# Patient Record
Sex: Female | Born: 1995 | Race: Black or African American | Hispanic: No | Marital: Single | Smoking: Never smoker
Health system: Southern US, Community
[De-identification: ages and names within clinical notes are randomized; demographics above are authoritative.]

## PROBLEM LIST (undated history)

## (undated) DIAGNOSIS — E282 Polycystic ovarian syndrome: Secondary | ICD-10-CM

## (undated) DIAGNOSIS — M2550 Pain in unspecified joint: Secondary | ICD-10-CM

## (undated) DIAGNOSIS — I10 Essential (primary) hypertension: Secondary | ICD-10-CM

## (undated) DIAGNOSIS — R7303 Prediabetes: Secondary | ICD-10-CM

## (undated) DIAGNOSIS — M1990 Unspecified osteoarthritis, unspecified site: Secondary | ICD-10-CM

## (undated) DIAGNOSIS — K5900 Constipation, unspecified: Secondary | ICD-10-CM

## (undated) DIAGNOSIS — F419 Anxiety disorder, unspecified: Secondary | ICD-10-CM

## (undated) DIAGNOSIS — M549 Dorsalgia, unspecified: Secondary | ICD-10-CM

## (undated) DIAGNOSIS — E119 Type 2 diabetes mellitus without complications: Secondary | ICD-10-CM

## (undated) DIAGNOSIS — R002 Palpitations: Secondary | ICD-10-CM

## (undated) DIAGNOSIS — Z87448 Personal history of other diseases of urinary system: Secondary | ICD-10-CM

## (undated) DIAGNOSIS — F32A Depression, unspecified: Secondary | ICD-10-CM

## (undated) HISTORY — DX: Unspecified osteoarthritis, unspecified site: M19.90

## (undated) HISTORY — DX: Depression, unspecified: F32.A

## (undated) HISTORY — DX: Anxiety disorder, unspecified: F41.9

## (undated) HISTORY — DX: Prediabetes: R73.03

## (undated) HISTORY — DX: Palpitations: R00.2

## (undated) HISTORY — DX: Pain in unspecified joint: M25.50

## (undated) HISTORY — DX: Personal history of other diseases of urinary system: Z87.448

## (undated) HISTORY — DX: Dorsalgia, unspecified: M54.9

## (undated) HISTORY — DX: Constipation, unspecified: K59.00

---
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
## 2013-08-29 DIAGNOSIS — M25571 Pain in right ankle and joints of right foot: Secondary | ICD-10-CM | POA: Insufficient documentation

## 2013-08-29 DIAGNOSIS — E282 Polycystic ovarian syndrome: Secondary | ICD-10-CM | POA: Insufficient documentation

## 2013-08-29 DIAGNOSIS — I10 Essential (primary) hypertension: Secondary | ICD-10-CM | POA: Insufficient documentation

## 2014-02-06 DIAGNOSIS — N938 Other specified abnormal uterine and vaginal bleeding: Secondary | ICD-10-CM | POA: Insufficient documentation

## 2015-08-03 ENCOUNTER — Encounter (HOSPITAL_BASED_OUTPATIENT_CLINIC_OR_DEPARTMENT_OTHER): Payer: Self-pay | Admitting: *Deleted

## 2015-08-03 ENCOUNTER — Emergency Department (HOSPITAL_BASED_OUTPATIENT_CLINIC_OR_DEPARTMENT_OTHER)
Admission: EM | Admit: 2015-08-03 | Discharge: 2015-08-03 | Disposition: A | Discharge status: Home / Self Care | Payer: No Typology Code available for payment source | Attending: Emergency Medicine | Admitting: Emergency Medicine

## 2015-08-03 DIAGNOSIS — Z79899 Other long term (current) drug therapy: Secondary | ICD-10-CM | POA: Insufficient documentation

## 2015-08-03 DIAGNOSIS — M546 Pain in thoracic spine: Secondary | ICD-10-CM | POA: Diagnosis not present

## 2015-08-03 DIAGNOSIS — Z7984 Long term (current) use of oral hypoglycemic drugs: Secondary | ICD-10-CM | POA: Insufficient documentation

## 2015-08-03 DIAGNOSIS — Y939 Activity, unspecified: Secondary | ICD-10-CM | POA: Diagnosis not present

## 2015-08-03 DIAGNOSIS — Y999 Unspecified external cause status: Secondary | ICD-10-CM | POA: Diagnosis not present

## 2015-08-03 DIAGNOSIS — M7052 Other bursitis of knee, left knee: Secondary | ICD-10-CM | POA: Diagnosis not present

## 2015-08-03 DIAGNOSIS — Y929 Unspecified place or not applicable: Secondary | ICD-10-CM | POA: Diagnosis not present

## 2015-08-03 DIAGNOSIS — I10 Essential (primary) hypertension: Secondary | ICD-10-CM | POA: Diagnosis not present

## 2015-08-03 DIAGNOSIS — M719 Bursopathy, unspecified: Secondary | ICD-10-CM

## 2015-08-03 HISTORY — DX: Polycystic ovarian syndrome: E28.2

## 2015-08-03 HISTORY — DX: Essential (primary) hypertension: I10
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
## 2015-08-03 NOTE — Discharge Instructions (Signed)
Back Exercises °The following exercises strengthen the muscles that help to support the back. They also help to keep the lower back flexible. Doing these exercises can help to prevent back pain or lessen existing pain. °If you have back pain or discomfort, try doing these exercises 2-3 times each day or as told by your health care provider. When the pain goes away, do them once each day, but increase the number of times that you repeat the steps for each exercise (do more repetitions). If you do not have back pain or discomfort, do these exercises once each day or as told by your health care provider. °EXERCISES °Single Knee to Chest °Repeat these steps 3-5 times for each leg: °· Lie on your back on a firm bed or the floor with your legs extended. °· Bring one knee to your chest. Your other leg should stay extended and in contact with the floor. °· Hold your knee in place by grabbing your knee or thigh. °· Pull on your knee until you feel a gentle stretch in your lower back. °· Hold the stretch for 10-30 seconds. °· Slowly release and straighten your leg. °Pelvic Tilt °Repeat these steps 5-10 times: °· Lie on your back on a firm bed or the floor with your legs extended. °· Bend your knees so they are pointing toward the ceiling and your feet are flat on the floor. °· Tighten your lower abdominal muscles to press your lower back against the floor. This motion will tilt your pelvis so your tailbone points up toward the ceiling instead of pointing to your feet or the floor. °· With gentle tension and even breathing, hold this position for 5-10 seconds. °Cat-Cow °Repeat these steps until your lower back becomes more flexible: °· Get into a hands-and-knees position on a firm surface. Keep your hands under your shoulders, and keep your knees under your hips. You may place padding under your knees for comfort. °· Let your head hang down, and point your tailbone toward the floor so your lower back becomes rounded like the  back of a cat. °· Hold this position for 5 seconds. °· Slowly lift your head and point your tailbone up toward the ceiling so your back forms a sagging arch like the back of a cow. °· Hold this position for 5 seconds. °Press-Ups °Repeat these steps 5-10 times: °· Lie on your abdomen (face-down) on the floor. °· Place your palms near your head, about shoulder-width apart. °· While you keep your back as relaxed as possible and keep your hips on the floor, slowly straighten your arms to raise the top half of your body and lift your shoulders. Do not use your back muscles to raise your upper torso. You may adjust the placement of your hands to make yourself more comfortable. °· Hold this position for 5 seconds while you keep your back relaxed. °· Slowly return to lying flat on the floor. °Bridges °Repeat these steps 10 times: °1. Lie on your back on a firm surface. °2. Bend your knees so they are pointing toward the ceiling and your feet are flat on the floor. °3. Tighten your buttocks muscles and lift your buttocks off of the floor until your waist is at almost the same height as your knees. You should feel the muscles working in your buttocks and the back of your thighs. If you do not feel these muscles, slide your feet 1-2 inches farther away from your buttocks. °4. Hold this position for 3-5   seconds. °5. Slowly lower your hips to the starting position, and allow your buttocks muscles to relax completely. °If this exercise is too easy, try doing it with your arms crossed over your chest. °Abdominal Crunches °Repeat these steps 5-10 times: °1. Lie on your back on a firm bed or the floor with your legs extended. °2. Bend your knees so they are pointing toward the ceiling and your feet are flat on the floor. °3. Cross your arms over your chest. °4. Tip your chin slightly toward your chest without bending your neck. °5. Tighten your abdominal muscles and slowly raise your trunk (torso) high enough to lift your shoulder  blades a tiny bit off of the floor. Avoid raising your torso higher than that, because it can put too much stress on your low back and it does not help to strengthen your abdominal muscles. °6. Slowly return to your starting position. °Back Lifts °Repeat these steps 5-10 times: °1. Lie on your abdomen (face-down) with your arms at your sides, and rest your forehead on the floor. °2. Tighten the muscles in your legs and your buttocks. °3. Slowly lift your chest off of the floor while you keep your hips pressed to the floor. Keep the back of your head in line with the curve in your back. Your eyes should be looking at the floor. °4. Hold this position for 3-5 seconds. °5. Slowly return to your starting position. °SEEK MEDICAL CARE IF: °· Your back pain or discomfort gets much worse when you do an exercise. °· Your back pain or discomfort does not lessen within 2 hours after you exercise. °If you have any of these problems, stop doing these exercises right away. Do not do them again unless your health care provider says that you can. °SEEK IMMEDIATE MEDICAL CARE IF: °· You develop sudden, severe back pain. If this happens, stop doing the exercises right away. Do not do them again unless your health care provider says that you can. °  °This information is not intended to replace advice given to you by your health care provider. Make sure you discuss any questions you have with your health care provider. °  °Document Released: 05/08/2004 Document Revised: 12/20/2014 Document Reviewed: 05/25/2014 °Elsevier Interactive Patient Education ©2016 [employer] ° °Back Pain, Adult °Back pain is very common in adults. The cause of back pain is rarely dangerous and the pain often gets better over time. The cause of your back pain may not be known. Some common causes of back pain include: °· Strain of the muscles or ligaments supporting the spine. °· Wear and tear (degeneration) of the spinal disks. °· Arthritis. °· Direct injury  to the back. °For many people, back pain may return. Since back pain is rarely dangerous, most people can learn to manage this condition on their own. °HOME CARE INSTRUCTIONS °Watch your back pain for any changes. The following actions may help to lessen any discomfort you are feeling: °· Remain active. It is stressful on your back to sit or stand in one place for long periods of time. Do not sit, drive, or stand in one place for more than 30 minutes at a time. Take short walks on even surfaces as soon as you are able. Try to increase the length of time you walk each day. °· Exercise regularly as directed by your health care provider. Exercise helps your back heal faster. It also helps avoid future injury by keeping your muscles strong and flexible. °· Do not stay in   bed. Resting more than 1-2 days can delay your recovery. °· Pay attention to your body when you bend and lift. The most comfortable positions are those that put less stress on your recovering back. Always use proper lifting techniques, including: °¨ Bending your knees. °¨ Keeping the load close to your body. °¨ Avoiding twisting. °· Find a comfortable position to sleep. Use a firm mattress and lie on your side with your knees slightly bent. If you lie on your back, put a pillow under your knees. °· Avoid feeling anxious or stressed. Stress increases muscle tension and can worsen back pain. It is important to recognize when you are anxious or stressed and learn ways to manage it, such as with exercise. °· Take medicines only as directed by your health care provider. Over-the-counter medicines to reduce pain and inflammation are often the most helpful. Your health care provider may prescribe muscle relaxant drugs. These medicines help dull your pain so you can more quickly return to your normal activities and healthy exercise. °· Apply ice to the injured area: °¨ Put ice in a plastic bag. °¨ Place a towel between your skin and the bag. °¨ Leave the ice on  for 20 minutes, 2-3 times a day for the first 2-3 days. After that, ice and heat may be alternated to reduce pain and spasms. °· Maintain a healthy weight. Excess weight puts extra stress on your back and makes it difficult to maintain good posture. °SEEK MEDICAL CARE IF: °· You have pain that is not relieved with rest or medicine. °· You have increasing pain going down into the legs or buttocks. °· You have pain that does not improve in one week. °· You have night pain. °· You lose weight. °· You have a fever or chills. °SEEK IMMEDIATE MEDICAL CARE IF:  °· You develop new bowel or bladder control problems. °· You have unusual weakness or numbness in your arms or legs. °· You develop nausea or vomiting. °· You develop abdominal pain. °· You feel faint. °  °This information is not intended to replace advice given to you by your health care provider. Make sure you discuss any questions you have with your health care provider. °  °Document Released: 03/31/2005 Document Revised: 04/21/2014 Document Reviewed: 08/02/2013 °Elsevier Interactive Patient Education ©2016 [employer] ° °

## 2015-08-03 NOTE — ED Notes (Signed)
Pt made aware to return if symptoms worsen or if any life threatening symptoms occur.   

## 2018-07-31 ENCOUNTER — Other Ambulatory Visit: Payer: Self-pay

## 2018-07-31 ENCOUNTER — Emergency Department (HOSPITAL_BASED_OUTPATIENT_CLINIC_OR_DEPARTMENT_OTHER): Payer: Self-pay

## 2018-07-31 ENCOUNTER — Emergency Department (HOSPITAL_BASED_OUTPATIENT_CLINIC_OR_DEPARTMENT_OTHER)
Admission: EM | Admit: 2018-07-31 | Discharge: 2018-08-01 | Disposition: A | Discharge status: Home / Self Care | Payer: Self-pay | Attending: Emergency Medicine | Admitting: Emergency Medicine

## 2018-07-31 ENCOUNTER — Encounter (HOSPITAL_BASED_OUTPATIENT_CLINIC_OR_DEPARTMENT_OTHER): Payer: Self-pay

## 2018-07-31 DIAGNOSIS — Z79899 Other long term (current) drug therapy: Secondary | ICD-10-CM | POA: Insufficient documentation

## 2018-07-31 DIAGNOSIS — R0789 Other chest pain: Secondary | ICD-10-CM | POA: Insufficient documentation

## 2018-07-31 DIAGNOSIS — Y999 Unspecified external cause status: Secondary | ICD-10-CM | POA: Insufficient documentation

## 2018-07-31 DIAGNOSIS — Z7984 Long term (current) use of oral hypoglycemic drugs: Secondary | ICD-10-CM | POA: Insufficient documentation

## 2018-07-31 DIAGNOSIS — E119 Type 2 diabetes mellitus without complications: Secondary | ICD-10-CM | POA: Insufficient documentation

## 2018-07-31 DIAGNOSIS — Y92410 Unspecified street and highway as the place of occurrence of the external cause: Secondary | ICD-10-CM | POA: Insufficient documentation

## 2018-07-31 DIAGNOSIS — Y9389 Activity, other specified: Secondary | ICD-10-CM | POA: Insufficient documentation

## 2018-07-31 DIAGNOSIS — S60812A Abrasion of left wrist, initial encounter: Secondary | ICD-10-CM | POA: Insufficient documentation

## 2018-07-31 DIAGNOSIS — I10 Essential (primary) hypertension: Secondary | ICD-10-CM | POA: Insufficient documentation

## 2018-07-31 DIAGNOSIS — M25532 Pain in left wrist: Secondary | ICD-10-CM

## 2018-07-31 DIAGNOSIS — T148XXA Other injury of unspecified body region, initial encounter: Secondary | ICD-10-CM

## 2018-07-31 HISTORY — DX: Type 2 diabetes mellitus without complications: E11.9

## 2018-07-31 MED ORDER — ACETAMINOPHEN 500 MG PO TABS
1000.0000 mg | ORAL_TABLET | Freq: Once | ORAL | Status: AC
  Administered 2018-07-31: 1000 mg via ORAL
  Filled 2018-07-31: qty 2
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
## 2018-07-31 NOTE — ED Notes (Signed)
ED Provider at bedside. 

## 2018-07-31 NOTE — ED Notes (Signed)
PMS intact before and after. Pt tolerated well. All questions answered. 
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
## 2018-08-01 MED ORDER — METHOCARBAMOL 500 MG PO TABS: 500.0000 mg | ORAL_TABLET | Freq: Two times a day (BID) | ORAL | 0 refills | Status: DC
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
## 2018-08-02 ENCOUNTER — Other Ambulatory Visit: Payer: Self-pay

## 2018-08-02 ENCOUNTER — Emergency Department (HOSPITAL_COMMUNITY)
Admission: EM | Admit: 2018-08-02 | Discharge: 2018-08-02 | Disposition: A | Discharge status: Home / Self Care | Payer: Self-pay | Attending: Emergency Medicine | Admitting: Emergency Medicine

## 2018-08-02 ENCOUNTER — Encounter (HOSPITAL_COMMUNITY): Payer: Self-pay

## 2018-08-02 DIAGNOSIS — I10 Essential (primary) hypertension: Secondary | ICD-10-CM | POA: Insufficient documentation

## 2018-08-02 DIAGNOSIS — E119 Type 2 diabetes mellitus without complications: Secondary | ICD-10-CM | POA: Insufficient documentation

## 2018-08-02 DIAGNOSIS — Z79899 Other long term (current) drug therapy: Secondary | ICD-10-CM | POA: Insufficient documentation

## 2018-08-02 DIAGNOSIS — Z7984 Long term (current) use of oral hypoglycemic drugs: Secondary | ICD-10-CM | POA: Insufficient documentation

## 2018-08-02 DIAGNOSIS — J029 Acute pharyngitis, unspecified: Secondary | ICD-10-CM | POA: Insufficient documentation

## 2018-08-02 MED ORDER — FAMOTIDINE 20 MG PO TABS
20.0000 mg | ORAL_TABLET | Freq: Once | ORAL | Status: AC
  Administered 2018-08-02: 22:00:00 20 mg via ORAL
  Filled 2018-08-02: qty 1

## 2018-08-02 NOTE — ED Notes (Signed)
Pt tolerating fluids with no difficulty  

## 2018-08-02 NOTE — ED Provider Notes (Signed)
[facility]-EMERGENCY DEPT Provider Note   CSN: [id number] Arrival date & time: 08/02/18  2102    History   Chief Complaint Chief Complaint  Patient presents with  . Sore Throat    "tingling"    HPI Megan Malone is a 23 y.o. female with history of diabetes, hypertension presented emergency department today with chief complaint of throat pain.  Onset is acute starting 40 minutes prior to arrival.  Patient states she ate homemade rice krispy treats that had laced with marijuana and immediately after eating her throat started to feel numb. The numbness is located in posterior throat. She admits to feeling anxious. She did not take anything for pain prior to arrival.  She denies any shortness of breath, difficulty swallowing, chest pain, palpitations, abdominal pain, vomiting. History provided by pt.    Past Medical History:  Diagnosis Date  . Diabetes mellitus without complication (HCC)   . Hypertension   . Polycystic ovarian syndrome     There are no active problems to display for this patient.   History reviewed. No pertinent surgical history.   OB History   No obstetric history on file.      Home Medications    Prior to Admission medications   Medication Sig Start Date End Date Taking? Authorizing Provider  metFORMIN (GLUCOPHAGE) 500 MG tablet Take by mouth 2 (two) times daily with a meal.    [provider]  methocarbamol (ROBAXIN) 500 MG tablet Take 1 tablet (500 mg total) by mouth 2 (two) times daily. 08/01/18   Maxwell Caul, PA-C  triamterene-hydrochlorothiazide (DYAZIDE) 37.5-25 MG capsule Take 1 capsule by mouth daily.    [provider]    Family History No family history on file.  Social History Social History   Tobacco Use  . Smoking status: Never Smoker  . Smokeless tobacco: Never Used  Substance Use Topics  . Alcohol use: No  . Drug use: Not Currently     Allergies   Patient has no known allergies.   Review of Systems Review of Systems  Constitutional: Negative for chills and fever.  HENT: Positive for sore throat. Negative for congestion, ear discharge, ear pain, sinus pressure and sinus pain.   Eyes: Negative for pain, redness and visual disturbance.  Respiratory: Negative for cough and shortness of breath.   Cardiovascular: Negative for chest pain.  Gastrointestinal: Negative for abdominal pain, constipation, diarrhea, nausea and vomiting.  Genitourinary: Negative for dysuria and hematuria.  Musculoskeletal: Negative for back pain and neck pain.  Skin: Negative for wound.  Neurological: Negative for weakness, numbness and headaches.     Physical Exam Updated Vital Signs BP 111/66 (BP Location: Left Arm)   Pulse (!) 125   Temp 98.4 F (36.9 C) (Oral)   Ht 5\' 10"  (1.778 m)   Wt (!) 158.8 kg   LMP 07/20/2018   SpO2 100%   BMI 50.22 kg/m   Physical Exam Vitals signs and nursing note reviewed.  Constitutional:      Appearance: She is well-developed. She is not toxic-appearing.     Comments: Airway is intact. Pt is in no acute distress.  HENT:     Head: Normocephalic and atraumatic.     Nose: Nose normal.     Mouth/Throat:     Mouth: Mucous membranes are moist.     Pharynx: Oropharynx is clear.     Comments: No erythema to oropharynx, no edema, no exudate, no tonsillar swelling, voice normal, neck supple without  lymphadenopathy  Eyes:     General: No scleral icterus.       Right eye: No discharge.        Left eye: No discharge.     Extraocular Movements: Extraocular movements intact.     Conjunctiva/sclera: Conjunctivae normal.     Pupils: Pupils are equal, round, and reactive to light.  Neck:     Musculoskeletal: Normal range of motion. No neck rigidity or muscular tenderness.  Cardiovascular:     Rate and Rhythm: Regular rhythm. Tachycardia present.     Pulses: Normal pulses.     Heart sounds: Normal heart sounds.  Pulmonary:     Effort: Pulmonary effort  is normal.     Breath sounds: Normal breath sounds.  Abdominal:     General: There is no distension.  Musculoskeletal: Normal range of motion.  Lymphadenopathy:     Cervical: No cervical adenopathy.  Skin:    General: Skin is warm and dry.  Neurological:     Mental Status: She is oriented to person, place, and time.     Comments: Fluent speech, no facial droop.  Psychiatric:        Mood and Affect: Mood is anxious.        Behavior: Behavior normal.      ED Treatments / Results  Labs (all labs ordered are listed, but only abnormal results are displayed) Labs Reviewed - No data to display  EKG None  Radiology None  Procedures Procedures (including critical care time)  Medications Ordered in ED Medications  famotidine (PEPCID) tablet 20 mg (20 mg Oral Given 08/02/18 2208)     Initial Impression / Assessment and Plan / ED Course  I have reviewed the triage vital signs and the nursing notes.  Pertinent labs & imaging results that were available during my care of the patient were reviewed by me and considered in my medical decision making (see chart for details).  Pt is anxious, otherwise well appearing. She admits to eating homemade rice krispy treats laced with marijuana just prior to arrival. Pt's airway is intact, with SpO2 100% on room air. Will give PO pepcid and PO fluids and reassess.  Pt's tachycardia improved at discharge. Looking through EMR she has elevated heart rate at multiple visits. Encourage pt to continue PO fluids. She feels better after drinking ice water.   Patient is hemodynamically stable, in NAD, and able to ambulate in the ED. Evaluation does not show pathology that would require ongoing emergent intervention or inpatient treatment. I explained the diagnosis to the patient. Patient is comfortable with above plan and is stable for discharge at this time. All questions were answered prior to disposition. Strict return precautions for returning to the  ED were discussed. Encouraged follow up with PCP.  This note was prepared with assistance of Conservation officer, historic buildingsDragon voice recognition software. Occasional wrong-word or sound-a-like substitutions may have occurred due to the inherent limitations of voice recognition software.  Final Clinical Impressions(s) / ED Diagnoses   Final diagnoses:  Sore throat    ED Discharge Orders    None       Kathyrn Lasslbrizze, Kaitlyn E, PA-C 08/02/18 2249    Lorre NickAllen, Anthony, MD 08/04/18 [id number]

## 2018-08-02 NOTE — ED Triage Notes (Signed)
Pt stating she has felt tingling and tightness in her throat that started about 30 minutes ago. Pt stating she tried edibles for the first time before this started.

## 2018-08-02 NOTE — Discharge Instructions (Addendum)
You have been seen today for sore throat. Please read and follow all provided instructions. Return to the emergency room for worsening condition or new concerning symptoms.    1. Medications:  Continue usual home medications. Take medications as prescribed. Please review all of the medicines and only take them if you do not have an allergy to them.  2. Treatment: rest, drink plenty of fluids 3. Follow Up: Please follow up with your primary doctor in 2-5 days for discussion of your diagnoses and further evaluation after today's visit; Call today to arrange your follow up.    It is also a possibility that you have an allergic reaction to any of the medicines that you have been prescribed - Everybody reacts differently to medications and while MOST people have no trouble with most medicines, you may have a reaction such as nausea, vomiting, rash, swelling, shortness of breath. If this is the case, please stop taking the medicine immediately and contact your physician.  ?

## 2019-02-02 DIAGNOSIS — F329 Major depressive disorder, single episode, unspecified: Secondary | ICD-10-CM | POA: Insufficient documentation

## 2019-12-27 ENCOUNTER — Other Ambulatory Visit: Payer: Self-pay

## 2019-12-27 ENCOUNTER — Other Ambulatory Visit: Payer: Self-pay | Admitting: Critical Care Medicine

## 2019-12-27 DIAGNOSIS — Z20822 Contact with and (suspected) exposure to covid-19: Secondary | ICD-10-CM

## 2019-12-28 ENCOUNTER — Other Ambulatory Visit: Payer: Self-pay

## 2019-12-29 LAB — NOVEL CORONAVIRUS, NAA: SARS-CoV-2, NAA: NOT DETECTED

## 2019-12-29 LAB — SARS-COV-2, NAA 2 DAY TAT
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
## 2020-08-13 DIAGNOSIS — F411 Generalized anxiety disorder: Secondary | ICD-10-CM | POA: Insufficient documentation

## 2020-10-26 ENCOUNTER — Other Ambulatory Visit: Payer: Self-pay

## 2022-04-15 LAB — HM PAP SMEAR

## 2023-04-24 DIAGNOSIS — D219 Benign neoplasm of connective and other soft tissue, unspecified: Secondary | ICD-10-CM | POA: Insufficient documentation

## 2023-08-24 ENCOUNTER — Ambulatory Visit (INDEPENDENT_AMBULATORY_CARE_PROVIDER_SITE_OTHER): Payer: Self-pay | Admitting: Family Medicine

## 2023-08-24 ENCOUNTER — Encounter (INDEPENDENT_AMBULATORY_CARE_PROVIDER_SITE_OTHER): Payer: Self-pay

## 2023-08-24 DIAGNOSIS — E282 Polycystic ovarian syndrome: Secondary | ICD-10-CM | POA: Diagnosis not present

## 2023-08-24 DIAGNOSIS — Z0000 Encounter for general adult medical examination without abnormal findings: Secondary | ICD-10-CM

## 2023-08-24 DIAGNOSIS — F419 Anxiety disorder, unspecified: Secondary | ICD-10-CM | POA: Insufficient documentation

## 2023-08-24 DIAGNOSIS — F32A Depression, unspecified: Secondary | ICD-10-CM | POA: Insufficient documentation

## 2023-08-24 DIAGNOSIS — E782 Mixed hyperlipidemia: Secondary | ICD-10-CM | POA: Diagnosis not present

## 2023-08-24 DIAGNOSIS — L219 Seborrheic dermatitis, unspecified: Secondary | ICD-10-CM

## 2023-08-24 MED ORDER — ZEPBOUND 2.5 MG/0.5ML ~~LOC~~ SOAJ: 2.5000 mg | SUBCUTANEOUS | 0 refills | Status: DC

## 2023-08-24 NOTE — Assessment & Plan Note (Signed)
 Seborrheic dermatitis managed with ketoconazole shampoo. - Continue ketoconazole shampoo as needed.

## 2023-08-24 NOTE — Assessment & Plan Note (Signed)
 PCOS diagnosed in 2015. Previous metformin treatment ineffective. Managed with lifestyle modifications. - Continue lifestyle modifications including low-carb diet and exercise.

## 2023-08-24 NOTE — Patient Instructions (Signed)

## 2023-08-24 NOTE — Assessment & Plan Note (Signed)
 Obesity with current weight of 354 lbs. Previous weight loss with keto diet and intermittent fasting. Discussed challenges with weight management and potential benefits of GLP-1 receptor agonists. Zepbound preferred for weight loss and protective benefits for kidneys and heart. - Order Zepbound (GLP-1 receptor agonist) to assist with weight loss, pending insurance approval. - Refer to [facility] for comprehensive weight management. - Discuss potential self-pay option for Zepbound if insurance does not cover. - Encourage continuation of low-carb diet and exercise with a trainer.

## 2023-08-24 NOTE — Progress Notes (Signed)
 New Patient Office Visit  Subjective    Patient ID: Megan Malone, female    DOB: [birth year]  Age: 28 y.o. MRN: [id number]  CC:  Chief Complaint  Patient presents with   Establish Care    HPI Megan Malone presents to establish care. Living with dad and brother. 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    Discussed the use of AI scribe software for clinical note transcription with the patient, who gave verbal consent to proceed.  History of Present Illness Megan Malone is a 28 year old female with prediabetes, hypertension, and PCOS who presents for a new primary care provider and weight management consultation.  She is seeking a new primary care provider as her previous provider was out of network. She has a history of prediabetes, hypertension, and polycystic ovary syndrome (PCOS). She was diagnosed with prediabetes in 2015 and believes her last A1c was at the cutoff for diabetes.  She has been on Qsymia for weight management for a year, but ran out of the medication two weeks ago and has noticed a difference in her appetite and weight since stopping it. She was previously referred to a bariatric weight management program, but her insurance did not cover the specialist, and she was unable to afford [facility], which was recommended as an alternative.  She has a history of anxiety and depression, for which she takes Wellbutrin 300 mg once daily, Prozac 40 mg once daily, and hydroxyzine as needed. She also takes Zyrtec for allergies, ketoconazole shampoo for seborrheic dermatitis, magnesium, meloxicam, and hydrochlorothiazide 25 mg as needed for swelling. Hydrochlorothiazide helps with water retention, particularly when she is on her period.   Her family history includes a mother who had asthma, high blood pressure, arthritis, depression, and died early at 72 due to a mass in the abdominal region leading to bleeding. Her father has diabetes, high blood pressure, kidney disease, and varicose veins.  She has a brother with high blood pressure and another with an intellectual disability. Her father is currently discussing dialysis due to a GFR of 14, prompting her to check her own GFR.  She reports occasional alcohol use, no drug use, and is not sexually active. She does not use tobacco and has no medication allergies. Her periods are regular. She has a history of high cholesterol, which was noted to be a little high last year.  She has attempted weight loss through keto and intermittent fasting, losing 100 pounds initially, but regained weight after her mother's death. She is currently at 354 pounds, with a goal weight of 225 pounds. She experiences joint pain in her knees and hips, affecting her work and ability to squat. She has a trainer she meets with once a week but struggles with consistency in exercise.        Outpatient Encounter Medications as of 08/24/2023  Medication Sig   buPROPion (WELLBUTRIN XL) 300 MG 24 hr tablet Take 300 mg by mouth daily.   cetirizine (ZYRTEC) 10 MG tablet Take 10 mg by mouth.   FLUoxetine (PROZAC) 40 MG capsule Take 40 mg by mouth daily.   hydrochlorothiazide (HYDRODIURIL) 25 MG tablet Take 25 mg by mouth daily as needed (edema).   hydrOXYzine (ATARAX) 10 MG tablet Take 10 mg by mouth every 8 (eight) hours as needed.   ketoconazole (NIZORAL) 2 % shampoo APPLY TOPICALLY 2 TIMES A WEEK AS NEEDED   MAGNESIUM PO Take by mouth.   meloxicam (MOBIC) 7.5 MG tablet Take 7.5 mg by mouth daily.  QSYMIA 7.5-46 MG CP24 Take 1 capsule by mouth daily.   tirzepatide (ZEPBOUND) 2.5 MG/0.5ML Pen Inject 2.5 mg into the skin once a week.   [DISCONTINUED] albuterol (VENTOLIN HFA) 108 (90 Base) MCG/ACT inhaler Inhale 2 puffs into the lungs.   [DISCONTINUED] tirzepatide (MOUNJARO) 2.5 MG/0.5ML Pen Inject 2.5 mg into the skin once a week.   [DISCONTINUED] metFORMIN (GLUCOPHAGE) 500 MG tablet Take by mouth 2 (two) times daily with a meal.   [DISCONTINUED] methocarbamol   (ROBAXIN ) 500 MG tablet Take 1 tablet (500 mg total) by mouth 2 (two) times daily.   [DISCONTINUED] triamterene-hydrochlorothiazide (DYAZIDE) 37.5-25 MG capsule Take 1 capsule by mouth daily.   No facility-administered encounter medications on file as of 08/24/2023.    Past Medical History:  Diagnosis Date   Anxiety 2022   Arthritis 2022   Depression 2022   Diabetes mellitus without complication (HCC)    Hypertension    Polycystic ovarian syndrome     History reviewed. No pertinent surgical history.  Family History  Problem Relation Age of Onset   Arthritis Mother    Asthma Mother    Depression Mother    Early death Mother 67   Hypertension Mother    Diabetes Father    Hypertension Father    Kidney disease Father    Varicose Veins Father    Hypertension Brother    Intellectual disability Brother    Varicose Veins Paternal Grandmother    Kidney disease Maternal Aunt    Kidney disease Paternal Aunt    Kidney disease Paternal Uncle     Social History   Socioeconomic History   Marital status: Single    Spouse name: Not on file   Number of children: Not on file   Years of education: Not on file   Highest education level: Associate degree: occupational, Scientist, product/process development, or vocational program  Occupational History   Not on file  Tobacco Use   Smoking status: Never   Smokeless tobacco: Never  Substance and Sexual Activity   Alcohol use: Yes    Alcohol/week: 3.0 standard drinks of alcohol    Types: 3 Glasses of wine per week   Drug use: Never   Sexual activity: Never  Other Topics Concern   Not on file  Social History Narrative   Not on file   Social Drivers of Health   Financial Resource Strain: Medium Risk (08/24/2023)   Overall Financial Resource Strain (CARDIA)    Difficulty of Paying Living Expenses: Somewhat hard  Food Insecurity: No Food Insecurity (08/24/2023)   Hunger Vital Sign    Worried About Running Out of Food in the Last Year: Never true    Ran Out of  Food in the Last Year: Never true  Transportation Needs: Unmet Transportation Needs (08/24/2023)   PRAPARE - Transportation    Lack of Transportation (Medical): No    Lack of Transportation (Non-Medical): Yes  Physical Activity: Insufficiently Active (08/24/2023)   Exercise Vital Sign    Days of Exercise per Week: 2 days    Minutes of Exercise per Session: 20 min  Stress: No Stress Concern Present (08/24/2023)   Megan Malone [employer] - Occupational Stress Questionnaire    Feeling of Stress : Only a little  Social Connections: Moderately Integrated (08/24/2023)   Social Connection and Isolation Panel [NHANES]    Frequency of Communication with Friends and Family: More than three times a week    Frequency of Social Gatherings with Friends and Family: Once a week  Attends Religious Services: More than 4 times per year    Active Member of Clubs or Organizations: Yes    Attends Banker Meetings: More than 4 times per year    Marital Status: Never married  Intimate Partner Violence: Unknown (07/19/2021)   Received from [facility]   HITS    Physically Hurt: Not on file    Insult or Talk Down To: Not on file    Threaten Physical Harm: Not on file    Scream or Curse: Not on file    ROS All review of systems negative except what is listed in the HPI      Objective    BP 128/83   Pulse (!) 102   Ht 5' 9.75" (1.772 m)   Wt (!) 354 lb (160.6 kg)   SpO2 98%   BMI 51.16 kg/m   Physical Exam Vitals reviewed.  Constitutional:      Appearance: Normal appearance. She is obese.  Cardiovascular:     Rate and Rhythm: Normal rate and regular rhythm.  Pulmonary:     Effort: Pulmonary effort is normal.     Breath sounds: Normal breath sounds.  Skin:    General: Skin is warm and dry.  Neurological:     Mental Status: She is alert and oriented to person, place, and time.  Psychiatric:        Mood and Affect: Mood normal.        Behavior: Behavior  normal.        Thought Content: Thought content normal.        Judgment: Judgment normal.            Assessment & Plan:   Problem List Items Addressed This Visit       Active Problems   Morbid obesity (HCC) - Primary   Obesity with current weight of 354 lbs. Previous weight loss with keto diet and intermittent fasting. Discussed challenges with weight management and potential benefits of GLP-1 receptor agonists. Currently off Qsymia. Zepbound preferred for weight loss and protective benefits for kidneys and heart. - Order Zepbound (GLP-1 receptor agonist) to assist with weight loss, pending insurance approval. - [facility]er to [facility] for comprehensive weight management. - Discuss potential self-pay option for Zepbound if insurance does not cover. - Encourage continuation of low-carb diet and exercise with a trainer.      Relevant Medications      tirzepatide (ZEPBOUND) 2.5 MG/0.5ML Pen   Other Relevant Orders   CBC with Differential/Platelet   Comprehensive metabolic panel with GFR   Lipid panel   TSH   Amb Ref to Medical Weight Management   PCOS (polycystic ovarian syndrome)   PCOS diagnosed in 2015. Previous metformin treatment ineffective. Managed with lifestyle modifications. - Continue lifestyle modifications including low-carb diet and exercise.      Seborrheic dermatitis   Seborrheic dermatitis managed with ketoconazole shampoo. - Continue ketoconazole shampoo as needed.      Anxiety and depression   Depression/anxiety managed with Wellbutrin 300 mg daily, Prozac 40 mg daily, and hydroxyzine as needed. Mood well controlled. - Continue current medications for depression management.      Relevant Medications   FLUoxetine (PROZAC) 40 MG capsule   buPROPion (WELLBUTRIN XL) 300 MG 24 hr tablet   hydrOXYzine (ATARAX) 10 MG tablet   Other Visit Diagnoses       Encounter for medical examination to establish care         Mixed  hyperlipidemia       Relevant Medications   hydrochlorothiazide (HYDRODIURIL) 25 MG tablet   Other Relevant Orders   Lipid panel       Return if symptoms worsen or fail to improve, for / pending lab results.   Everlina Hock, NP

## 2023-08-24 NOTE — Assessment & Plan Note (Signed)
 Depression/anxiety managed with Wellbutrin 300 mg daily, Prozac 40 mg daily, and hydroxyzine as needed. Mood well controlled. - Continue current medications for depression management.

## 2023-08-25 ENCOUNTER — Ambulatory Visit: Payer: Self-pay | Admitting: Family Medicine

## 2023-08-25 LAB — CBC WITH DIFFERENTIAL/PLATELET
Basophils Absolute: 0 10*3/uL (ref 0.0–0.1)
Basophils Relative: 0.6 % (ref 0.0–3.0)
Eosinophils Absolute: 0.1 10*3/uL (ref 0.0–0.7)
Eosinophils Relative: 3.4 % (ref 0.0–5.0)
HCT: 40.3 % (ref 36.0–46.0)
Hemoglobin: 13.4 g/dL (ref 12.0–15.0)
Lymphocytes Relative: 35.8 % (ref 12.0–46.0)
Lymphs Abs: 1.5 10*3/uL (ref 0.7–4.0)
MCHC: 33.2 g/dL (ref 30.0–36.0)
MCV: 93.4 fl (ref 78.0–100.0)
Monocytes Absolute: 0.4 10*3/uL (ref 0.1–1.0)
Monocytes Relative: 9.6 % (ref 3.0–12.0)
Neutro Abs: 2.2 10*3/uL (ref 1.4–7.7)
Neutrophils Relative %: 50.6 % (ref 43.0–77.0)
Platelets: 309 10*3/uL (ref 150.0–400.0)
RBC: 4.31 Mil/uL (ref 3.87–5.11)
RDW: 13.6 % (ref 11.5–15.5)
WBC: 4.3 10*3/uL (ref 4.0–10.5)

## 2023-08-25 LAB — COMPREHENSIVE METABOLIC PANEL WITH GFR
ALT: 17 U/L (ref 0–35)
AST: 16 U/L (ref 0–37)
Albumin: 4 g/dL (ref 3.5–5.2)
Alkaline Phosphatase: 58 U/L (ref 39–117)
BUN: 25 mg/dL — ABNORMAL HIGH (ref 6–23)
CO2: 29 meq/L (ref 19–32)
Calcium: 9.5 mg/dL (ref 8.4–10.5)
Chloride: 102 meq/L (ref 96–112)
Creatinine, Ser: 1.12 mg/dL (ref 0.40–1.20)
GFR: 66.26 mL/min (ref >60.00)
Glucose, Bld: 70 mg/dL (ref 70–99)
Potassium: 4.1 meq/L (ref 3.5–5.1)
Sodium: 138 meq/L (ref 135–145)
Total Bilirubin: 0.3 mg/dL (ref 0.2–1.2)
Total Protein: 7.1 g/dL (ref 6.0–8.3)

## 2023-08-25 LAB — LIPID PANEL
Cholesterol: 191 mg/dL (ref 0–200)
HDL: 53.7 mg/dL (ref >39.00)
LDL Cholesterol: 108 mg/dL — ABNORMAL HIGH (ref 0–99)
NonHDL: 136.83
Total CHOL/HDL Ratio: 4
Triglycerides: 144 mg/dL (ref 0.0–149.0)
VLDL: 28.8 mg/dL (ref 0.0–40.0)

## 2023-08-25 LAB — TSH: TSH: 1.86 u[IU]/mL (ref 0.35–5.50)

## 2023-08-31 ENCOUNTER — Encounter (INDEPENDENT_AMBULATORY_CARE_PROVIDER_SITE_OTHER): Payer: Self-pay

## 2023-09-01 ENCOUNTER — Other Ambulatory Visit: Payer: Self-pay | Admitting: Family Medicine

## 2023-09-01 MED ORDER — BUPROPION HCL ER (XL) 300 MG PO TB24: 300.0000 mg | ORAL_TABLET | Freq: Every day | ORAL | 1 refills | Status: DC

## 2023-09-01 NOTE — Telephone Encounter (Unsigned)
 Copied from CRM [id number]. Topic: Clinical - Medication Refill >> Sep 01, 2023 12:57 PM Megan Malone wrote: Medication: buPROPion (WELLBUTRIN XL) 300 MG 24 hr tablet  Has the patient contacted their pharmacy? Yes- nomore refills  (Agent: If no, request that the patient contact the pharmacy for the refill. If patient does not wish to contact the pharmacy document the reason why and proceed with request.) (Agent: If yes, when and what did the pharmacy advise?)  This is the patient's preferred pharmacy:  [id number], [place] - [address] AT [facility] [address] [place] [address] Phone: [phone] Fax: [phone]  Is this the correct pharmacy for this prescription? Yes If no, delete pharmacy and type the correct one.   Has the prescription been filled recently? No  Is the patient out of the medication? Yes  Has the patient been seen for an appointment in the last year OR does the patient have an upcoming appointment? Yes  Can we respond through MyChart? Yes  Agent: Please be advised that Rx refills may take up to 3 business days. We ask that you follow-up with your pharmacy.

## 2023-09-17 ENCOUNTER — Ambulatory Visit (INDEPENDENT_AMBULATORY_CARE_PROVIDER_SITE_OTHER): Admitting: Family Medicine

## 2023-09-17 VITALS — BP 121/81 | HR 90 | Ht 68.5 in | Wt 359.0 lb

## 2023-09-17 DIAGNOSIS — F32A Depression, unspecified: Secondary | ICD-10-CM

## 2023-09-17 DIAGNOSIS — E282 Polycystic ovarian syndrome: Secondary | ICD-10-CM

## 2023-09-17 DIAGNOSIS — F419 Anxiety disorder, unspecified: Secondary | ICD-10-CM

## 2023-09-17 DIAGNOSIS — Z6841 Body Mass Index (BMI) 40.0 and over, adult: Secondary | ICD-10-CM | POA: Diagnosis not present

## 2023-09-17 DIAGNOSIS — E66813 Obesity, class 3: Secondary | ICD-10-CM | POA: Diagnosis not present

## 2023-09-17 DIAGNOSIS — I10 Essential (primary) hypertension: Secondary | ICD-10-CM

## 2023-09-17 DIAGNOSIS — Z0289 Encounter for other administrative examinations: Secondary | ICD-10-CM

## 2023-09-17 NOTE — Progress Notes (Unsigned)
 Office: [phone]  /  Fax: [phone]   Initial Visit  Megan Malone was seen in clinic today to evaluate for obesity. She is interested in losing weight to improve overall health and reduce the risk of weight related complications. She presents today to review program treatment options, initial physical assessment, and evaluation.     She was referred by: PCP  When asked what else they would like to accomplish? She states: Improve existing medical conditions and Improve quality of life  first goal is to get under 300 lb  Weight history: weight has slowly increased since 2023.  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 Overweight since childhood  When asked how has your weight affected you? She states: Contributed to medical problems, Contributed to orthopedic problems or mobility issues, and Having fatigue  Some associated conditions: PCOS  Contributing factors: family history of obesity, moderate to high levels of stress, and reduced physical acitivity  living with dad and brother and works 4 long days with limited sleep  Weight promoting medications identified: None  Current nutrition plan: None  Current level of physical activity: Strength training 30 minutes, once  Current or previous pharmacotherapy: Phentermine and Topiramate Took Qsymia and it was working-  Response to medication: was working well   Past medical history includes:   Past Medical History:  Diagnosis Date   Anxiety 2022   Arthritis 2022   Depression 2022   Diabetes mellitus without complication (HCC)    Hypertension    Polycystic ovarian syndrome      Objective:   BP 121/81   Pulse 90   Ht 5' 8.5" (1.74 m)   Wt (!) 359 lb (162.8 kg)   SpO2 97%   BMI 53.79 kg/m  She was weighed on the bioimpedance scale: Body mass index is 53.79 kg/m.  Peak Weight:420 , Body Fat%:55.8, Visceral Fat Rating:19, Weight trend over the last 12 months:  Increasing  General:  Alert, oriented and cooperative. Patient is in no acute distress.  Respiratory: Normal respiratory effort, no problems with respiration noted   Gait: able to ambulate independently  Mental Status: Normal mood and affect. Normal behavior. Normal judgment and thought content.   DIAGNOSTIC DATA REVIEWED:  BMET    Component Value Date/Time   NA 138 08/24/2023 1439   K 4.1 08/24/2023 1439   CL 102 08/24/2023 1439   CO2 29 08/24/2023 1439   GLUCOSE 70 08/24/2023 1439   BUN 25 (H) 08/24/2023 1439   CREATININE 1.12 08/24/2023 1439   CALCIUM 9.5 08/24/2023 1439   No results found for: "HGBA1C" No results found for: "INSULIN" CBC    Component Value Date/Time   WBC 4.3 08/24/2023 1439   RBC 4.31 08/24/2023 1439   HGB 13.4 08/24/2023 1439   HCT 40.3 08/24/2023 1439   PLT 309.0 08/24/2023 1439   MCV 93.4 08/24/2023 1439   MCHC 33.2 08/24/2023 1439   RDW 13.6 08/24/2023 1439   Iron/TIBC/Ferritin/ %Sat No results found for: "IRON", "TIBC", "FERRITIN", "IRONPCTSAT" Lipid Panel     Component Value Date/Time   CHOL 191 08/24/2023 1439   TRIG 144.0 08/24/2023 1439   HDL 53.70 08/24/2023 1439   CHOLHDL 4 08/24/2023 1439   VLDL 28.8 08/24/2023 1439   LDLCALC 108 (H) 08/24/2023 1439   Hepatic Function Panel     Component Value Date/Time   PROT 7.1 08/24/2023 1439   ALBUMIN 4.0 08/24/2023 1439   AST 16 08/24/2023 1439  ALT 17 08/24/2023 1439   ALKPHOS 58 08/24/2023 1439   BILITOT 0.3 08/24/2023 1439      Component Value Date/Time   TSH 1.86 08/24/2023 1439     Assessment and Plan:   There are no diagnoses linked to this encounter.      Obesity Treatment / Action Plan:  Patient will work on garnering support from family and friends to begin weight loss journey. Will work on eliminating or reducing the presence of highly palatable, calorie dense foods in the home. Will complete provided nutritional and psychosocial assessment questionnaire before  the next appointment. Will be scheduled for indirect calorimetry to determine resting energy expenditure in a fasting state.  This will allow us  to create a reduced calorie, high-protein meal plan to promote loss of fat mass while preserving muscle mass. Will think about ideas on how to incorporate physical activity into their daily routine. Counseled on the health benefits of losing 5%-15% of total body weight. Was counseled on nutritional approaches to weight loss and benefits of reducing processed foods and consuming plant-based foods and high quality protein as part of nutritional weight management. Was counseled on pharmacotherapy and role as an adjunct in weight management.   Obesity Education Performed Today:  She was weighed on the bioimpedance scale and results were discussed and documented in the synopsis.  We discussed obesity as a disease and the importance of a more detailed evaluation of all the factors contributing to the disease.  We discussed the importance of long term lifestyle changes which include nutrition, exercise and behavioral modifications as well as the importance of customizing this to her specific health and social needs.  We discussed the benefits of reaching a healthier weight to alleviate the symptoms of existing conditions and reduce the risks of the biomechanical, metabolic and psychological effects of obesity.  Megan Malone appears to be in the action stage of change and states they are ready to start intensive lifestyle modifications and behavioral modifications.  *** minutes was spent today on this visit including the above counseling, pre-visit chart review, and post-visit documentation.  Reviewed by clinician on day of visit: allergies, medications, problem list, medical history, surgical history, family history, social history, and previous encounter notes pertinent to obesity diagnosis.    Micky Albee, D.O. DABFM, [facility] [address] [place], [place] [address] [phone]

## 2023-10-21 ENCOUNTER — Ambulatory Visit: Admitting: Family Medicine

## 2023-10-21 ENCOUNTER — Encounter: Payer: Self-pay | Admitting: Family Medicine

## 2023-10-21 VITALS — BP 130/83 | HR 88 | Ht 68.5 in | Wt 366.0 lb

## 2023-10-21 DIAGNOSIS — M25561 Pain in right knee: Secondary | ICD-10-CM | POA: Diagnosis not present

## 2023-10-21 DIAGNOSIS — F32A Depression, unspecified: Secondary | ICD-10-CM | POA: Diagnosis not present

## 2023-10-21 DIAGNOSIS — I10 Essential (primary) hypertension: Secondary | ICD-10-CM

## 2023-10-21 DIAGNOSIS — Z6841 Body Mass Index (BMI) 40.0 and over, adult: Secondary | ICD-10-CM

## 2023-10-21 DIAGNOSIS — F419 Anxiety disorder, unspecified: Secondary | ICD-10-CM

## 2023-10-21 DIAGNOSIS — R0602 Shortness of breath: Secondary | ICD-10-CM | POA: Diagnosis not present

## 2023-10-21 DIAGNOSIS — E66813 Obesity, class 3: Secondary | ICD-10-CM

## 2023-10-21 DIAGNOSIS — G8929 Other chronic pain: Secondary | ICD-10-CM

## 2023-10-21 DIAGNOSIS — D219 Benign neoplasm of connective and other soft tissue, unspecified: Secondary | ICD-10-CM

## 2023-10-21 DIAGNOSIS — R5383 Other fatigue: Secondary | ICD-10-CM

## 2023-10-21 DIAGNOSIS — M25562 Pain in left knee: Secondary | ICD-10-CM

## 2023-10-21 DIAGNOSIS — R7303 Prediabetes: Secondary | ICD-10-CM | POA: Diagnosis not present

## 2023-10-21 DIAGNOSIS — E282 Polycystic ovarian syndrome: Secondary | ICD-10-CM

## 2023-10-21 NOTE — Progress Notes (Signed)
 At a Glance:  Vitals BP: 130/83 Pulse Rate: 88 SpO2: 97 %   Anthropometric Measurements Height: 5' 8.5 (1.74 m) Weight: (!) 366 lb (166 kg) BMI (Calculated): 54.83 Starting Weight: 366lb Peak Weight: 420lb   Body Composition  Body Fat %: 54.4 % Fat Mass (lbs): 199.6 lbs Muscle Mass (lbs): 158.8 lbs Total Body Water (lbs): 121.2 lbs Visceral Fat Rating : 19   Other Clinical Data RMR: 2520 Fasting: Yes Labs: Yes Today's Visit #: 1 Starting Date: 10/21/23    EKG: Normal sinus rhythm, rate 87 BPM with shortened PR interval 104 msec  Indirect Calorimeter completed today shows a VO2 of 364 and a REE of 2520.  Her calculated basal metabolic rate is 7487 thus her basal metabolic rate is better than expected.  Chief Complaint:  Obesity   Subjective:  Megan Malone (MR# [id number]) is a 28 y.o. female who presents for evaluation and treatment of obesity and related comorbidities.   Megan Malone is currently in the action stage of change and ready to dedicate time achieving and maintaining a healthier weight. Megan Malone is interested in becoming our patient and working on intensive lifestyle modifications including (but not limited to) diet and exercise for weight loss.  Megan Malone has been struggling with her weight. She has been unsuccessful in either losing weight, maintaining weight loss, or reaching her healthy weight goal. She has been overweight since childhood with some short term success on Qsymia.  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  She lives w/ her dad and her younger brother.  She does her own cooking and grocery shopping.  She hates to cook.  Megan Malone's habits were reviewed today and are as follows: her desired weight loss is >100 lb, she started gaining weight in childhood, she snacks frequently in the evenings, she is frequently drinking liquids with calories, she frequently makes poor food choices, she has problems with excessive hunger, and she  frequently eats larger portions than normal. She drinks 1/2 sweet tea, ETOH 2-4 drinks/ week.  She has a hx of binge eating.  Other Fatigue Megan Malone admits to daytime somnolence and admits to waking up still tired. Patient has a history of symptoms of morning fatigue. Megan Malone generally gets 5 or 6 hours of sleep per night, and states that she has difficulty falling asleep. Snoring is present. Apneic episodes are not present. Epworth Sleepiness Score is 7.   Shortness of Breath Megan Malone notes increasing shortness of breath with exercising and seems to be worsening over time with weight gain. She notes getting out of breath sooner with activity than she used to. This has gotten worse recently. Megan Malone denies shortness of breath at rest or orthopnea.   Depression Screen Megan Malone's Food and Mood (modified PHQ-9) score was 19.     08/24/2023    2:36 PM  Depression screen PHQ 2/9  Decreased Interest 1  Down, Depressed, Hopeless 1  PHQ - 2 Score 2  Altered sleeping 0  Tired, decreased energy 1  Change in appetite 1  Feeling bad or failure about yourself  0  Trouble concentrating 0  Moving slowly or fidgety/restless 1  Suicidal thoughts 0  PHQ-9 Score 5  Difficult doing work/chores Somewhat difficult     Assessment and Plan:   Other Fatigue Megan Malone does feel that her weight is causing her energy to be lower than it should be. Fatigue may be related to obesity, depression or many other causes. Labs will be ordered, and in the  meanwhile, Carnisha will focus on self care including making healthy food choices, increasing physical activity and focusing on stress reduction.  Shortness of Breath Megan Malone does feel that she gets out of breath more easily that she used to when she exercises. Megan Malone's shortness of breath appears to be obesity related and exercise induced. She has agreed to work on weight loss and gradually increase exercise to treat her exercise induced shortness of breath. Will continue to  monitor closely.  Megan Malone had a positive depression screening. Depression is commonly associated with obesity and often results in emotional eating behaviors. We will monitor this closely and work on CBT to help improve the non-hunger eating patterns. Referral to Psychology may be required if no improvement is seen as she continues in our clinic.    Problem List Items Addressed This Visit     Essential hypertension BP is at goal on hydrochlorothiazide 25 mg daily Look for improvements in BP control with weight reduction    PCOS (polycystic ovarian syndrome) Reports previous metformin use without adverse effects Menses are regular and heavy, not on birth control Check fasting insulin  today    Fibroids   Relevant Orders   Ferritin   Iron and TIBC   Anxiety and depression PHQ 9 score high, on Wellbutrin  XL 300 mg daily and Fluoxetine 40 mg daily Has good support system + hx of emotional eating with binging tendencies Has done counseling in the past, likely will need referral   Other Visit Diagnoses       SOBOE (shortness of breath on exertion)    -  Primary     Other fatigue       Relevant Orders   EKG 12-Lead (Completed)   VITAMIN D  25 Hydroxy (Vit-D Deficiency, Fractures)   Insulin , random   Hemoglobin A1c   Folate   Vitamin B12     Class 3 severe obesity due to excess calories with body mass index (BMI) of 50.0 to 59.9 in adult         Prediabetes     No results found for: HGBA1C Check A1c today Begin prescribed meal plan, low in sugar and starches      Chronic pain of both knees  Reports bilateral knee pain worse with stairs Knee pain limits walking No imaging available Changed from Ibuprofen to Tylenol  use prn Look for knee pain improvements with weight loss          Megan Malone is currently in the action stage of change and her goal is to get back to weightloss efforts . I recommend Megan Malone begin the structured treatment plan as follows:  She has agreed to  Category 4 Plan  Exercise goals: All adults should avoid inactivity. Some activity is better than none, and adults who participate in any amount of physical activity, gain some health benefits.  Behavioral modification strategies:increasing lean protein intake, decreasing simple carbohydrates, increase H2O intake, decrease liquid calories, decrease ETOH, increase high fiber foods, decreasing eating out, meal planning and cooking strategies, keeping healthy foods in the home, better snacking choices, avoiding temptations, planning for success, and decrease junk food   She was informed of the importance of frequent follow-up visits to maximize her success with intensive lifestyle modifications for her multiple health conditions. She was informed we would discuss her lab results at her next visit unless there is a critical issue that needs to be addressed sooner. Megan Malone agreed to keep her next visit at the agreed upon time to discuss these results.  Objective:  General: Cooperative, alert, well developed, in no acute distress. HEENT: Conjunctivae and lids unremarkable. Cardiovascular: Regular rhythm.  Lungs: Normal work of breathing. Neurologic: No focal deficits.   Lab Results  Component Value Date   CREATININE 1.12 08/24/2023   BUN 25 (H) 08/24/2023   NA 138 08/24/2023   K 4.1 08/24/2023   CL 102 08/24/2023   CO2 29 08/24/2023   Lab Results  Component Value Date   ALT 17 08/24/2023   AST 16 08/24/2023   ALKPHOS 58 08/24/2023   BILITOT 0.3 08/24/2023   No results found for: HGBA1C No results found for: INSULIN  Lab Results  Component Value Date   TSH 1.86 08/24/2023   Lab Results  Component Value Date   CHOL 191 08/24/2023   HDL 53.70 08/24/2023   LDLCALC 108 (H) 08/24/2023   TRIG 144.0 08/24/2023   CHOLHDL 4 08/24/2023   Lab Results  Component Value Date   WBC 4.3 08/24/2023   HGB 13.4 08/24/2023   HCT 40.3 08/24/2023   MCV 93.4 08/24/2023   PLT 309.0 08/24/2023    No results found for: IRON, TIBC, FERRITIN  Attestation Statements:  Reviewed by clinician on day of visit: allergies, medications, problem list, medical history, surgical history, family history, social history, and previous encounter notes.  Time spent on visit including pre-visit chart review and post-visit charting and face- to face care including nutritional counseling, review of EKG, interpretation of body composition scale and indirect calorimetry and nutrition prescription  was 42 minutes.   Darice Haddock, D.O. DABFM, DABOM [facility] [address] [place], [place] [address] [phone]

## 2023-10-22 LAB — VITAMIN B12: Vitamin B-12: 634 pg/mL (ref 232–1245)

## 2023-10-22 LAB — IRON AND TIBC
Iron Saturation: 18 % (ref 15–55)
Iron: 56 ug/dL (ref 27–159)
Total Iron Binding Capacity: 308 ug/dL (ref 250–450)
UIBC: 252 ug/dL (ref 131–425)

## 2023-10-22 LAB — FERRITIN: Ferritin: 34 ng/mL (ref 15–150)

## 2023-10-22 LAB — HEMOGLOBIN A1C
Est. average glucose Bld gHb Est-mCnc: 117 mg/dL
Hgb A1c MFr Bld: 5.7 % — ABNORMAL HIGH (ref 4.8–5.6)

## 2023-10-22 LAB — INSULIN, RANDOM: INSULIN: 10.7 u[IU]/mL (ref 2.6–24.9)

## 2023-10-22 LAB — FOLATE: Folate: 10.7 ng/mL (ref >3.0)

## 2023-10-22 LAB — VITAMIN D 25 HYDROXY (VIT D DEFICIENCY, FRACTURES): Vit D, 25-Hydroxy: 33.8 ng/mL (ref 30.0–100.0)

## 2023-10-26 ENCOUNTER — Ambulatory Visit: Payer: Self-pay | Admitting: Family Medicine

## 2023-10-29 ENCOUNTER — Ambulatory Visit (INDEPENDENT_AMBULATORY_CARE_PROVIDER_SITE_OTHER): Admitting: Family Medicine

## 2023-10-29 ENCOUNTER — Encounter: Payer: Self-pay | Admitting: Family Medicine

## 2023-10-29 ENCOUNTER — Other Ambulatory Visit (HOSPITAL_BASED_OUTPATIENT_CLINIC_OR_DEPARTMENT_OTHER): Payer: Self-pay

## 2023-10-29 VITALS — BP 111/74 | HR 77 | Temp 98.3°F | Ht 68.5 in | Wt 363.0 lb

## 2023-10-29 DIAGNOSIS — E66813 Obesity, class 3: Secondary | ICD-10-CM

## 2023-10-29 DIAGNOSIS — R7303 Prediabetes: Secondary | ICD-10-CM | POA: Diagnosis not present

## 2023-10-29 DIAGNOSIS — Z6841 Body Mass Index (BMI) 40.0 and over, adult: Secondary | ICD-10-CM

## 2023-10-29 DIAGNOSIS — E559 Vitamin D deficiency, unspecified: Secondary | ICD-10-CM | POA: Diagnosis not present

## 2023-10-29 DIAGNOSIS — R5383 Other fatigue: Secondary | ICD-10-CM

## 2023-10-29 MED ORDER — PHENTERMINE-TOPIRAMATE ER 3.75-23 MG PO CP24
1.0000 | ORAL_CAPSULE | Freq: Every morning | ORAL | 0 refills | Status: DC
  Filled 2023-10-29: qty 14, 14d supply, fill #0

## 2023-10-29 MED ORDER — PHENTERMINE-TOPIRAMATE ER 7.5-46 MG PO CP24
1.0000 | ORAL_CAPSULE | Freq: Every morning | ORAL | 0 refills | Status: DC
  Filled 2023-10-29: qty 30, 30d supply, fill #0

## 2023-10-29 NOTE — Patient Instructions (Addendum)
 Begin a Women's MVI daily and OTC vitamin D3 4,000 international units  daily  Continue cat 4 meal plan allowing 2 servings of ANY fresh fruit daily Allow 1/4 plate at dinner for a starch serving like: 1 cup of rice (Hunsberger or white) Barilla Protein pasta Beans (any kind) Carb balance tortilla Keto bun Sweet or white potato  Start Phentermine / topiramate  ER 3.75/23 mg each morning with or without breakfast x 14 days then increase to 7.5/46 mg each morning  Hydrate well with water Keep junk food out of sight Great job with cooking and meal planning!  Plan to get to the gym 3 days/ wk

## 2023-10-29 NOTE — Progress Notes (Signed)
 Office: [phone]  /  Fax: [phone]  WEIGHT SUMMARY AND BIOMETRICS  Starting Date: 10/21/23  Starting Weight: 366lb   Weight Lost Since Last Visit: 3lb   Vitals Temp: 98.3 F (36.8 C) BP: 111/74 Pulse Rate: 77 SpO2: 100 %   Body Composition  Body Fat %: 49.5 % Fat Mass (lbs): 179.6 lbs Muscle Mass (lbs): 174.2 lbs Total Body Water (lbs): 124.4 lbs Visceral Fat Rating : 17    HPI  Chief Complaint: OBESITY  Megan Malone is here to discuss her progress with her obesity treatment plan. She is on the the Category 4 Plan and states she is following her eating plan approximately 30 % of the time. She states she is doing strength training for 60 minutes 3 times per week.  Interval History:  Since last office visit she is down 3 lb She has been around junk food at work and at home but is trying to be mindful  She is sometimes hungry in the evening She has done strength training with her trainer 2-3 x a week and is doing some walking on the treadmill Denies meal skipping Still has occasional cravings for sweets  Pharmacotherapy: none  PHYSICAL EXAM:  Blood pressure 111/74, pulse 77, temperature 98.3 F (36.8 C), height 5' 8.5 (1.74 m), weight (!) 363 lb (164.7 kg), SpO2 100%. Body mass index is 54.39 kg/m.  General: She is overweight, cooperative, alert, well developed, and in no acute distress. PSYCH: Has normal mood, affect and thought process.   Lungs: Normal breathing effort, no conversational dyspnea.   ASSESSMENT AND PLAN  TREATMENT PLAN FOR OBESITY:  Recommended Dietary Goals  Megan Malone is currently in the action stage of change. As such, her goal is to continue weight management plan. She has agreed to the Category 4 Plan. Reviewed changes to dietary plan on after visit summary  Behavioral Intervention  We discussed the following Behavioral Modification Strategies today: increasing lean protein intake to established goals, increasing fiber rich  foods, increasing water intake , work on meal planning and preparation, reading food labels , keeping healthy foods at home, identifying sources and decreasing liquid calories, decreasing eating out or consumption of processed foods, and making healthy choices when eating convenient foods, practice mindfulness eating and understand the difference between hunger signals and cravings, avoiding temptations and identifying enticing environmental cues, and continue to work on maintaining a reduced calorie state, getting the recommended amount of protein, incorporating whole foods, making healthy choices, staying well hydrated and practicing mindfulness when eating..  Additional resources provided today: NA  Recommended Physical Activity Goals  [name] has been advised to work up to 150 minutes of moderate intensity aerobic activity a week and strengthening exercises 2-3 times per week for cardiovascular health, weight loss maintenance and preservation of muscle mass.   She has agreed to Think about enjoyable ways to increase daily physical activity and overcoming barriers to exercise and Increase physical activity in their day and reduce sedentary time (increase NEAT). Reviewed exercise change: After visit summary  Pharmacotherapy changes for the treatment of obesity: Begin phentermine /topiramate  ER 3.75/23 mg once daily with breakfast for 14 days then increase to 7.5/46 mg once daily with breakfast. She agrees the following forms of contraception: Abstinence and condoms.  We reviewed teratogenic side effects of topiramate . PDMP reviewed Blood pressure and heart rate are within normal limits  ASSOCIATED CONDITIONS ADDRESSED TODAY  Vitamin D  insufficiency Last vitamin D  Lab Results  Component Value Date   VD25OH 33.8 10/21/2023  Reviewed labs with patient from last visit.  Vitamin D  goal is over 50 to improve energy level, bone health and immune function.  She agrees to starting an over-the-counter  vitamin D3 at 4000 IU once daily.  Recheck level in the next 6 months  Class 3 severe obesity due to excess calories with body mass index (BMI) of 50.0 to 59.9 in adult -     Phentermine -Topiramate  ER; Take 1 capsule by mouth every morning.  Dispense: 14 capsule; Refill: 0 -     Phentermine -Topiramate  ER; Take 1 capsule by mouth every morning.  Dispense: 30 capsule; Refill: 0 She had some success on branded Qsymia  in the past and lacks insurance coverage for any GLP-1 receptor agonist.  She has been actively working on diet and exercise changes as prescribed.  She is a good candidate for phentermine /topiramate  ER 3.75/23 mg once daily (.  We discussed mechanism of action and potential for side effects.  She will use as a reduced calorie diet and plan for regular exercise to include both cardio and resistance training at least 3 days a week.  Prediabetes Lab Results  Component Value Date   HGBA1C 5.7 (H) 10/21/2023  Reviewed lab with patient from last visit.  She is just barely in the prediabetic range and I would anticipate resolution of prediabetes with diet, exercise and weight loss.  Recheck A1c in 6 months  Other fatigue She does have some complaints of fatigue which may be related to borderline low iron saturation, suboptimal vitamin D  vitamin D  levels and inadequate sleep at night.  Recommend start of a women's multivitamin once daily and OTC vitamin D  as described above.  Aim for 7 to 8 hours of high-quality sleep at night.  Continue to work on good nutrition and stress reduction.  Work on increasing walking time throughout the course of the day and keeping weight training at 3 days a week.     She was informed of the importance of frequent follow up visits to maximize her success with intensive lifestyle modifications for her multiple health conditions.   ATTESTASTION STATEMENTS:  Reviewed by clinician on day of visit: allergies, medications, problem list, medical history, surgical  history, family history, social history, and previous encounter notes pertinent to obesity diagnosis.   I have personally spent 30 minutes total time today in preparation, patient care, nutritional counseling and education,  and documentation for this visit, including the following: review of most recent clinical lab tests, prescribing medications/ refilling medications, reviewing medical assistant documentation, review and interpretation of bioimpedence results.     Darice Haddock, D.O. DABFM, DABOM [facility] [address] [place], [place] [address] [phone]

## 2023-10-30 ENCOUNTER — Other Ambulatory Visit (HOSPITAL_BASED_OUTPATIENT_CLINIC_OR_DEPARTMENT_OTHER): Payer: Self-pay

## 2023-11-05 ENCOUNTER — Other Ambulatory Visit (HOSPITAL_BASED_OUTPATIENT_CLINIC_OR_DEPARTMENT_OTHER): Payer: Self-pay

## 2023-11-09 ENCOUNTER — Other Ambulatory Visit: Payer: Self-pay | Admitting: Family Medicine

## 2023-11-09 DIAGNOSIS — Z6841 Body Mass Index (BMI) 40.0 and over, adult: Secondary | ICD-10-CM

## 2023-11-09 MED ORDER — PHENTERMINE-TOPIRAMATE ER 3.75-23 MG PO CP24: 1.0000 | ORAL_CAPSULE | Freq: Every morning | ORAL | 0 refills | Status: DC

## 2023-11-09 MED ORDER — PHENTERMINE-TOPIRAMATE ER 7.5-46 MG PO CP24: 1.0000 | ORAL_CAPSULE | Freq: Every morning | ORAL | 0 refills | Status: DC

## 2023-11-09 NOTE — Telephone Encounter (Signed)
 Pt is calling requesting her Qsymia  to be sent to [facility] on [address] in [place]. Rx was sent to [facility] and it cost more.

## 2023-11-11 ENCOUNTER — Encounter: Payer: Self-pay | Admitting: *Deleted

## 2023-11-12 ENCOUNTER — Telehealth: Payer: Self-pay | Admitting: Family Medicine

## 2023-11-12 DIAGNOSIS — E66813 Obesity, class 3: Secondary | ICD-10-CM

## 2023-11-12 MED ORDER — PHENTERMINE-TOPIRAMATE ER 7.5-46 MG PO CP24: 1.0000 | ORAL_CAPSULE | Freq: Every morning | ORAL | 0 refills | Status: DC

## 2023-11-12 MED ORDER — PHENTERMINE-TOPIRAMATE ER 3.75-23 MG PO CP24: 1.0000 | ORAL_CAPSULE | Freq: Every morning | ORAL | 0 refills | Status: DC

## 2023-11-12 NOTE — Telephone Encounter (Signed)
 Pt is calling because the [facility] is out of the starter dosage for Qsymia . Can you please send both RX to [facility] [address], [place].

## 2023-11-12 NOTE — Telephone Encounter (Signed)
 done

## 2023-12-03 ENCOUNTER — Ambulatory Visit: Admitting: Family Medicine

## 2024-01-14 ENCOUNTER — Ambulatory Visit: Admitting: Family Medicine

## 2024-01-14 ENCOUNTER — Encounter: Payer: Self-pay | Admitting: Family Medicine

## 2024-01-14 VITALS — BP 121/79 | HR 77 | Ht 68.5 in | Wt 355.0 lb

## 2024-01-14 DIAGNOSIS — R7303 Prediabetes: Secondary | ICD-10-CM | POA: Diagnosis not present

## 2024-01-14 DIAGNOSIS — F32A Depression, unspecified: Secondary | ICD-10-CM

## 2024-01-14 DIAGNOSIS — Z6841 Body Mass Index (BMI) 40.0 and over, adult: Secondary | ICD-10-CM

## 2024-01-14 DIAGNOSIS — F419 Anxiety disorder, unspecified: Secondary | ICD-10-CM

## 2024-01-14 DIAGNOSIS — E66813 Obesity, class 3: Secondary | ICD-10-CM | POA: Diagnosis not present

## 2024-01-14 MED ORDER — PHENTERMINE-TOPIRAMATE ER 7.5-46 MG PO CP24: 1.0000 | ORAL_CAPSULE | Freq: Every morning | ORAL | 1 refills | Status: DC

## 2024-01-14 NOTE — Progress Notes (Signed)
 Office: [phone]  /  Fax: [phone]  WEIGHT SUMMARY AND BIOMETRICS  Starting Date: 10/21/23  Starting Weight: 366lb   Weight Lost Since Last Visit: 8lb   Vitals BP: 121/79 Pulse Rate: 77 SpO2: 97 %   Body Composition  Body Fat %: 54.9 % Fat Mass (lbs): 195.4 lbs Muscle Mass (lbs): 152.4 lbs Total Body Water (lbs): 121.4 lbs Visceral Fat Rating : 19    HPI  Chief Complaint: OBESITY  Karcyn is here to discuss her progress with her obesity treatment plan. She is on the the Category 4 Plan and states she is following her eating plan approximately 40 % of the time. She states she is exercising 45 minutes 2 times per week.  Interval History:  Since last office visit she is down 8 lb, last seen 2 months ago She is doing well on Qsymia  without adverse SE She has more control without meal skipping She lacks a good food support system at home She has a net weight loss of 11 lb in the past 3 mos of medically supervised weight management This is a 3% TBW loss She is getting in more lean protein, fruits and veggies She is packing lunches for work She is doing weight training - 2 days/ wk and doing walking 20 min 3 days/wk She has had some emotional eating tendencies  Pharmacotherapy: Qsymia  7.5/46 mg once daily  PHYSICAL EXAM:  Blood pressure 121/79, pulse 77, height 5' 8.5 (1.74 m), weight (!) 355 lb (161 kg), SpO2 97%. Body mass index is 53.19 kg/m.  General: She is overweight, cooperative, alert, well developed, and in no acute distress. PSYCH: Has normal mood, affect and thought process.   Lungs: Normal breathing effort, no conversational dyspnea.   ASSESSMENT AND PLAN  TREATMENT PLAN FOR OBESITY:  Recommended Dietary Goals  Robinn is currently in the action stage of change. As such, her goal is to continue weight management plan. She has agreed to the Category 4 Plan.  Behavioral Intervention  We discussed the following Behavioral Modification  Strategies today: increasing lean protein intake to established goals, increasing fiber rich foods, increasing water intake , work on meal planning and preparation, keeping healthy foods at home, identifying sources and decreasing liquid calories, decreasing eating out or consumption of processed foods, and making healthy choices when eating convenient foods, continue to work on implementation of reduced calorie nutritional plan, continue to practice mindfulness when eating, and planning for success. Reviewed dietary change goals on after visit summary Additional resources provided today: NA  Recommended Physical Activity Goals  Suraiya has been advised to work up to 150 minutes of moderate intensity aerobic activity a week and strengthening exercises 2-3 times per week for cardiovascular health, weight loss maintenance and preservation of muscle mass.   She has agreed to Increase the intensity, frequency or duration of strengthening exercises  and Increase the intensity, frequency or duration of aerobic exercises   Reviewed exercise change goals on after visit summary  Pharmacotherapy changes for the treatment of obesity: None  ASSOCIATED CONDITIONS ADDRESSED TODAY  Anxiety and depression She notes a history of binge eating tendencies and emotional eating.  Stress levels remain fairly high and she lacks a great support system at home with her dad and brother.  She is currently on Wellbutrin  XL 300 mg once daily, fluoxetine 40 mg once daily and is not in counseling She agrees to a visit with Dr. Sharron for CBT  Class 3 severe obesity due to excess calories  with body mass index (BMI) of 50.0 to 59.9 in adult [facility]) -     Phentermine -Topiramate  ER; Take 1 capsule by mouth every morning.  Dispense: 30 capsule; Refill: 1 Blood pressure and heart rate are within normal limits and she agrees to using abstinence and condoms for birth control while on Qsymia .  She feels adequate improvement in satiety  without adverse side effect.  Continue Qsymia  7.5/46 mg once daily.  Reviewed dietary and exercise change goals on after visit summary.  Prediabetes Lab Results  Component Value Date   HGBA1C 5.7 (H) 10/21/2023  Continue active plan for weight reduction, reducing added sugar and refined carbohydrates.  She is at risk for prediabetes and insulin  resistance with PCOS.  Plan to repeat A1c in January.      She was informed of the importance of frequent follow up visits to maximize her success with intensive lifestyle modifications for her multiple health conditions.   ATTESTASTION STATEMENTS:  Reviewed by clinician on day of visit: allergies, medications, problem list, medical history, surgical history, family history, social history, and previous encounter notes pertinent to obesity diagnosis.   I have personally spent 30 minutes total time today in preparation, patient care, nutritional counseling and education,  and documentation for this visit, including the following: review of most recent clinical lab tests, prescribing medications/ refilling medications, reviewing medical assistant documentation, review and interpretation of bioimpedence results.     Darice Haddock, D.O. DABFM, DABOM [facility] [address] [place], [place] [address] [phone]

## 2024-01-14 NOTE — Patient Instructions (Addendum)
 Try out [employer] Delite breakfast sandwiches, egg white grill ([employer]), 2 Premier Protein frozen waffles + one serving of fresh fruit  Continue to bring lunches to work -- great job with meal planning Remember fruits and veggies  Continue to drink water and sugar free drinks  Aim for 30 min of cardio (can add in bike, elliptical, walking, etc) Continue personal trainer for weight training 2-3 x a week  Continue Qsymia  7.5/46 mg each morning

## 2024-01-14 NOTE — Addendum Note (Signed)
 Addended by: WAYLAN DARICE BRAVO on: 01/14/2024 02:25 PM   Modules accepted: Level of Service

## 2024-02-15 ENCOUNTER — Other Ambulatory Visit: Payer: Self-pay | Admitting: Family Medicine

## 2024-02-15 NOTE — Telephone Encounter (Unsigned)
 Copied from CRM [id number]. Topic: Clinical - Medication Refill >> Feb 15, 2024  2:19 PM Burnard DEL wrote: Medication: FLUoxetine (PROZAC) 40 MG capsule  buPROPion  (WELLBUTRIN  XL) 300 MG 24 hr tablet  Has the patient contacted their pharmacy? Yes (Agent: If no, request that the patient contact the pharmacy for the refill. If patient does not wish to contact the pharmacy document the reason why and proceed with request.) (Agent: If yes, when and what did the pharmacy advise?)  This is the patient's preferred pharmacy:  [id number] [place] - [address] AT [facility] [address] [place] [address] Phone: [phone] Fax: [phone]  [facility] [facility] [address] [place] [address] Phone: [phone] Fax: [phone]  [id number] - [place], [place] - [address] AT [facility] OF [address] [place] [address] Phone: [phone] Fax: [phone]    Is this the correct pharmacy for this prescription? Yes If no, delete pharmacy and type the correct one.   Has the prescription been filled recently? No  Is the patient out of the medication? Yes  Has the patient been seen for an appointment in the last year OR does the patient have an upcoming appointment? Yes  Can we respond through MyChart? Yes  Agent: Please be advised that Rx refills may take up to 3 business days. We ask that you follow-up with your pharmacy.

## 2024-02-16 MED ORDER — BUPROPION HCL ER (XL) 300 MG PO TB24: 300.0000 mg | ORAL_TABLET | Freq: Every day | ORAL | 0 refills | Status: DC

## 2024-02-16 MED ORDER — FLUOXETINE HCL 40 MG PO CAPS: 40.0000 mg | ORAL_CAPSULE | Freq: Every day | ORAL | 0 refills | Status: AC

## 2024-02-16 NOTE — Telephone Encounter (Signed)
 3 preferred pharmacies listed? Error CRM sent, which pharmacy does Pt want to use? Awaiting response.

## 2024-02-16 NOTE — Telephone Encounter (Signed)
 Attn: Canter, Kaylyn, CMA  Patient Megan Malone returned call and stated she wants her medication sent to this pharmacy:     [id number] - [place], [place] - [address] [facility] [address] [place] [address] Phone: [phone] Fax: [phone]

## 2024-02-25 ENCOUNTER — Ambulatory Visit: Admitting: Family Medicine

## 2024-03-15 ENCOUNTER — Other Ambulatory Visit: Payer: Self-pay | Admitting: Family Medicine

## 2024-03-21 ENCOUNTER — Encounter: Payer: Self-pay | Admitting: Family Medicine

## 2024-03-21 ENCOUNTER — Telehealth: Admitting: Family Medicine

## 2024-03-21 VITALS — BP 134/83 | HR 84 | Temp 98.8°F | Ht 68.5 in | Wt 359.0 lb

## 2024-03-21 DIAGNOSIS — R7303 Prediabetes: Secondary | ICD-10-CM

## 2024-03-21 DIAGNOSIS — I10 Essential (primary) hypertension: Secondary | ICD-10-CM

## 2024-03-21 DIAGNOSIS — F418 Other specified anxiety disorders: Secondary | ICD-10-CM

## 2024-03-21 DIAGNOSIS — F419 Anxiety disorder, unspecified: Secondary | ICD-10-CM

## 2024-03-21 DIAGNOSIS — Z6841 Body Mass Index (BMI) 40.0 and over, adult: Secondary | ICD-10-CM

## 2024-03-21 MED ORDER — PHENTERMINE-TOPIRAMATE ER 15-92 MG PO CP24: 1.0000 | ORAL_CAPSULE | Freq: Every day | ORAL | 0 refills | Status: DC

## 2024-03-21 MED ORDER — PHENTERMINE-TOPIRAMATE ER 11.25-69 MG PO CP24: 1.0000 | ORAL_CAPSULE | Freq: Every day | ORAL | 0 refills | Status: DC

## 2024-03-21 NOTE — Progress Notes (Signed)
 Office: [phone]  /  Fax: [phone]  WEIGHT SUMMARY AND BIOMETRICS  Starting Date: 10/21/23  Starting Weight: 366lb   Weight Lost Since Last Visit: 0lb   Vitals Temp: 98.8 F (37.1 C) BP: 134/83 Pulse Rate: 84 SpO2: 99 %   Body Composition  Body Fat %: 53.9 % Fat Mass (lbs): 193.6 lbs Muscle Mass (lbs): 157.2 lbs Total Body Water (lbs): 118.2 lbs Visceral Fat Rating : 19   I connected with  Megan Malone on 03/21/24 by a video and audio enabled telemedicine application and verified that I am speaking with the correct person using two identifiers.  Patient location: in car, alone  Provider Location: Office/Clinic  Persons Participating in Visit: Patient.  I discussed the limitations of evaluation and management by telemedicine. The patient expressed understanding and agreed to proceed.   Vital Signs: Because this visit was a virtual/telehealth visit, some criteria may be missing or patient reported. Any vitals not documented were not able to be obtained and vitals that have been documented are patient reported.      HPI  Chief Complaint: OBESITY  Megan Malone is here to discuss her progress with her obesity treatment plan. She is on the the Category 4 Plan and states she is following her eating plan approximately 0 % of the time. She states she is exercising 0 minutes 0 times per week.  Interval History:  Since last office visit she is up 4 lb This gives her a net weight loss of 7 lb in 5 mos of medically supervised weight management She has done well on Qsymia  7.5/46 mg qAM but has struggled more lately with overeating and over snacking Stress levels have been high and she is waiting to start counseling thru work.   She has had more binging tendencies She has some paresthesias from Qsymia  but no other side effects  Pharmacotherapy: Qsymia  10/16/44 mg qAM  PHYSICAL EXAM:  Blood pressure 134/83, pulse 84, temperature 98.8 F (37.1 C), height 5' 8.5 (1.74  m), weight (!) 359 lb (162.8 kg), SpO2 99%. Body mass index is 53.79 kg/m.  General: She is overweight, cooperative, alert, well developed, and in no acute distress. PSYCH: Has normal mood, affect and thought process.   Lungs: Normal breathing effort, no conversational dyspnea.   ASSESSMENT AND PLAN  TREATMENT PLAN FOR OBESITY:  Recommended Dietary Goals  Megan Malone is currently in the action stage of change. As such, her goal is to continue weight management plan. She has agreed to the Category 4 Plan.  Behavioral Intervention  We discussed the following Behavioral Modification Strategies today: increasing lean protein intake to established goals, increasing fiber rich foods, avoiding skipping meals, increasing water intake , work on meal planning and preparation, keeping healthy foods at home, practice mindfulness eating and understand the difference between hunger signals and cravings, work on managing stress, creating time for self-care and relaxation, avoiding temptations and identifying enticing environmental cues, and continue to practice mindfulness when eating.  Additional resources provided today: NA  Recommended Physical Activity Goals  Megan Malone has been advised to work up to 150 minutes of moderate intensity aerobic activity a week and strengthening exercises 2-3 times per week for cardiovascular health, weight loss maintenance and preservation of muscle mass.   She has agreed to Think about enjoyable ways to increase daily physical activity and overcoming barriers to exercise and Increase physical activity in their day and reduce sedentary time (increase NEAT).  Pharmacotherapy changes for the treatment of obesity: increase Qsymia  to  11.25/69 mg qAM x 14 days then increase to 15/92 mg qAM  ASSOCIATED CONDITIONS ADDRESSED TODAY  Anxiety and depression Worsened Mood and stress have impacted her healthy eating habits and motivation to exercise over the past 2 mos.  She is  preparing to start counseling thru her employer and denies being a threat to herself or others.  Reports good compliance on Wellbutrin  XL 300 mg daily, Fluoxetine  40 mg daily.    Continue to prioritize self care and sleep at night Start counseling  Morbid obesity (HCC) BP/ HR are WNL on Qsymia  Having worsening hunger and cravings, will increase to dose 3 x 14 days then dose 4.  Cautioned about not abruptly stopping dose 4 of qsymia .  Avoid pregnancy due to risk of teratogenic SE  Patient was counseled on the importance of maintaining healthy lifestyle habits, including balanced nutrition, regular physical activity, and behavioral modifications, while taking antiobesity medication.  Patient verbalized understanding that medication is an adjunct to, not a replacement for, lifestyle changes and that the long-term success and weight maintenance depend on continued adherence to these strategies.  -     Phentermine -Topiramate  ER; Take 1 capsule by mouth daily.  Dispense: 14 capsule; Refill: 0 -     Phentermine -Topiramate  ER; Take 1 capsule by mouth daily.  Dispense: 30 capsule; Refill: 0  BMI 50.0-59.9, adult (HCC)  Prediabetes Lab Results  Component Value Date   HGBA1C 5.7 (H) 10/21/2023   Continue to limit high sugar food and drinks while increasing walking time Repeat A1c in the next 3 mos  Essential hypertension BP is controlled, only on hydrochlorothiazide for edema (prn)     She was informed of the importance of frequent follow up visits to maximize her success with intensive lifestyle modifications for her multiple health conditions.   ATTESTASTION STATEMENTS:  Reviewed by clinician on day of visit: allergies, medications, problem list, medical history, surgical history, family history, social history, and previous encounter notes pertinent to obesity diagnosis.   I have personally spent 30 minutes total time today in preparation, patient care, nutritional counseling and  education,  and documentation for this visit, including the following: review of most recent clinical lab tests, prescribing medications/ refilling medications, reviewing medical assistant documentation, review and interpretation of bioimpedence results.     Darice Haddock, D.O. DABFM, DABOM [facility] [address] [place], [place] [address] [phone]

## 2024-04-11 ENCOUNTER — Ambulatory Visit: Admitting: Family Medicine

## 2024-04-11 ENCOUNTER — Encounter: Payer: Self-pay | Admitting: Family Medicine

## 2024-04-11 VITALS — BP 131/82 | HR 92 | Temp 98.7°F | Ht 68.5 in | Wt 364.0 lb

## 2024-04-11 DIAGNOSIS — R632 Polyphagia: Secondary | ICD-10-CM

## 2024-04-11 DIAGNOSIS — F50819 Binge eating disorder, unspecified: Secondary | ICD-10-CM | POA: Diagnosis not present

## 2024-04-11 DIAGNOSIS — R7303 Prediabetes: Secondary | ICD-10-CM

## 2024-04-11 DIAGNOSIS — F339 Major depressive disorder, recurrent, unspecified: Secondary | ICD-10-CM

## 2024-04-11 DIAGNOSIS — Z6841 Body Mass Index (BMI) 40.0 and over, adult: Secondary | ICD-10-CM

## 2024-04-11 MED ORDER — TIRZEPATIDE-WEIGHT MANAGEMENT 2.5 MG/0.5ML ~~LOC~~ SOLN: 2.5000 mg | SUBCUTANEOUS | 0 refills | Status: DC

## 2024-04-11 NOTE — Progress Notes (Signed)
 "  Office: [phone]  /  Fax: [phone]  WEIGHT SUMMARY AND BIOMETRICS  Starting Date: 10/21/23  Starting Weight: 366lb   Weight Lost Since Last Visit: 0lb   Vitals Temp: 98.7 F (37.1 C) BP: 131/82 Pulse Rate: 92 SpO2: 97 %   Body Composition  Body Fat %: 54 % Fat Mass (lbs): 196.8 lbs Muscle Mass (lbs): 159.4 lbs Total Body Water (lbs): 120.2 lbs Visceral Fat Rating : 19    HPI  Chief Complaint: OBESITY  Megan Malone is here to discuss her progress with her obesity treatment plan. She is on the the Category 4 Plan and states she is following her eating plan approximately 0 % of the time. She states she is exercising 45 minutes 1 times per week.  Interval History:  Since last office visit she is up 5 lb She is going thru a breakup and is struggling with emotional eating She is trying to keep trigger foods out of the house but is more prone to eating fast foods and binge eating She lacks a good support system at home or work She did start counseling which is helping She has never used a GLP- RA She is not on birth control (abstinent) She has a net weight loss of 2 pounds and 6 months of medically supervised weight management She never increased her dose of Qsymia , still on 7.5/46 mg once daily not seeing much improvements  Pharmacotherapy: Qsymia  7.5/46 mg once daily  PHYSICAL EXAM:  Blood pressure 131/82, pulse 92, temperature 98.7 F (37.1 C), height 5' 8.5 (1.74 m), weight (!) 364 lb (165.1 kg), SpO2 97%. Body mass index is 54.54 kg/m.  General: She is overweight, cooperative, alert, well developed, and in no acute distress. PSYCH: Has normal mood, affect and thought process.   Lungs: Normal breathing effort, no conversational dyspnea.  ASSESSMENT AND PLAN  TREATMENT PLAN FOR OBESITY:  Recommended Dietary Goals  Megan Malone is currently in the action stage of change. As such, her goal is to continue weight management plan. She has agreed to the Category  4 Plan.  Behavioral Intervention  We discussed the following Behavioral Modification Strategies today: increasing lean protein intake to established goals, increasing fiber rich foods, increasing water intake , work on meal planning and preparation, keeping healthy foods at home, continue to practice mindfulness when eating, and planning for success.  Additional resources provided today: NA  Recommended Physical Activity Goals  Megan Malone has been advised to work up to 150 minutes of moderate intensity aerobic activity a week and strengthening exercises 2-3 times per week for cardiovascular health, weight loss maintenance and preservation of muscle mass.   She has agreed to Start aerobic activity with a goal of 150 minutes a week at moderate intensity.   Pharmacotherapy changes for the treatment of obesity: Discontinue Qsymia  Begin Zepbound  2.5 mg once weekly injection Patient denies a personal or family history of pancreatitis, medullary thyroid carcinoma or multiple endocrine neoplasia type II. Recommend reviewing pen training video online.  ASSOCIATED CONDITIONS ADDRESSED TODAY  Binge eating Worsening with increased stress at home Continue counseling Work on eating on a schedule, keeping junk food out of the house, lean protein and fiber with meals Look for improvements with new start Zepbound   Morbid obesity (HCC) Patient was counseled on the importance of maintaining healthy lifestyle habits, including balanced nutrition, regular physical activity, and behavioral modifications, while taking antiobesity medication.  Patient verbalized understanding that medication is an adjunct to, not a replacement for, lifestyle changes and  that the long-term success and weight maintenance depend on continued adherence to these strategies. -     Tirzepatide -Weight Management; Inject 2.5 mg into the skin once a week.  Dispense: 2 mL; Refill: 0  BMI 50.0-59.9, adult (HCC)  Depression,  recurrent Continue bupropion  XL 300 mg once daily and fluoxetine  40 mg once daily Continue counseling We discussed the importance of addressing mental health and behavior change needed for weight loss.  Prediabetes Lab Results  Component Value Date   HGBA1C 5.7 (H) 10/21/2023  Look for improvements in prediabetes by rechecking A1c in the next 3 months.  Continue active plan for weight loss.        She was informed of the importance of frequent follow up visits to maximize her success with intensive lifestyle modifications for her multiple health conditions.   ATTESTASTION STATEMENTS:  Reviewed by clinician on day of visit: allergies, medications, problem list, medical history, surgical history, family history, social history, and previous encounter notes pertinent to obesity diagnosis.   I have personally spent 30 minutes total time today in preparation, patient care, nutritional counseling and education,  and documentation for this visit, including the following: review of most recent clinical lab tests, prescribing medications/ refilling medications, reviewing medical assistant documentation, review and interpretation of bioimpedence results.     Darice Haddock, D.O. DABFM, DABOM [facility] [address] [place], [place] [address] [phone] "

## 2024-05-12 ENCOUNTER — Ambulatory Visit: Payer: Self-pay | Admitting: Family Medicine

## 2024-05-12 ENCOUNTER — Encounter: Payer: Self-pay | Admitting: Family Medicine

## 2024-05-12 VITALS — BP 115/80 | HR 80 | Temp 98.7°F | Ht 68.5 in | Wt 358.0 lb

## 2024-05-12 DIAGNOSIS — Z6841 Body Mass Index (BMI) 40.0 and over, adult: Secondary | ICD-10-CM | POA: Diagnosis not present

## 2024-05-12 DIAGNOSIS — F339 Major depressive disorder, recurrent, unspecified: Secondary | ICD-10-CM

## 2024-05-12 DIAGNOSIS — R7303 Prediabetes: Secondary | ICD-10-CM | POA: Diagnosis not present

## 2024-05-12 DIAGNOSIS — F50819 Binge eating disorder, unspecified: Secondary | ICD-10-CM | POA: Diagnosis not present

## 2024-05-12 DIAGNOSIS — R632 Polyphagia: Secondary | ICD-10-CM

## 2024-05-12 MED ORDER — TIRZEPATIDE-WEIGHT MANAGEMENT 5 MG/0.5ML ~~LOC~~ SOLN: 5.0000 mg | SUBCUTANEOUS | 0 refills | Status: DC

## 2024-05-12 MED ORDER — TIRZEPATIDE-WEIGHT MANAGEMENT 5 MG/0.5ML ~~LOC~~ SOLN: 5.0000 mg | SUBCUTANEOUS | 0 refills | Status: AC

## 2024-05-12 NOTE — Progress Notes (Signed)
 "  Office: [phone]  /  Fax: [phone]  WEIGHT SUMMARY AND BIOMETRICS  Starting Date: 10/21/23  Starting Weight: 366lb   Weight Lost Since Last Visit: 6lb   Vitals Temp: 98.7 F (37.1 C) BP: 115/80 Pulse Rate: 80 SpO2: 96 %   Body Composition  Body Fat %: 52.9 % Fat Mass (lbs): 189.6 lbs Muscle Mass (lbs): 160.4 lbs Total Body Water (lbs): 115 lbs Visceral Fat Rating : 18     HPI  Chief Complaint: OBESITY  Megan Malone is here to discuss her progress with her obesity treatment plan. She is on the the Category 4 Plan and states she is following her eating plan approximately 30 % of the time. She states she is exercising 45 minutes 1 times per week.   Interval History:  Since last office visit she is down 6 lb She is up 1.6 lb of muscle mass and down 7.2 lb of body fat since lat visit This gives her a net weight loss of 8 lb in 7 mos of medically supervised weight management She started on Zepbound  2.5mg  weekly She is enjoying the improved satiety over previous use of Qsymia  She denies heartburn or nausea but did have some constipation She is a bit hungrier day 5 post injection Zepbound  has kept her from excessive eating  Pharmacotherapy: Zepbound  2.5 mg weekly  PHYSICAL EXAM:  Blood pressure 115/80, pulse 80, temperature 98.7 F (37.1 C), height 5' 8.5 (1.74 m), weight (!) 358 lb (162.4 kg), SpO2 96%. Body mass index is 53.64 kg/m.  General: She is overweight, cooperative, alert, well developed, and in no acute distress. PSYCH: Has normal mood, affect and thought process.   Lungs: Normal breathing effort, no conversational dyspnea.  ASSESSMENT AND PLAN  TREATMENT PLAN FOR OBESITY:  Recommended Dietary Goals  [name] is currently in the action stage of change. As such, her goal is to continue weight management plan. She has agreed to the Category 4 Plan.  Behavioral Intervention  We discussed the following Behavioral Modification Strategies today:  increasing lean protein intake to established goals, increasing fiber rich foods, increasing water intake , work on meal planning and preparation, keeping healthy foods at home, work on managing stress, creating time for self-care and relaxation, avoiding temptations and identifying enticing environmental cues, and continue to practice mindfulness when eating.  Additional resources provided today: NA  Recommended Physical Activity Goals  Nathaniel has been advised to work up to 150 minutes of moderate intensity aerobic activity a week and strengthening exercises 2-3 times per week for cardiovascular health, weight loss maintenance and preservation of muscle mass.   She has agreed to Increase the intensity, frequency or duration of strengthening exercises  and Increase the intensity, frequency or duration of aerobic exercises   Gym goal set for 3 days/ wk  Pharmacotherapy changes for the treatment of obesity: increase Zepbound  to 5 mg weekly  ASSOCIATED CONDITIONS ADDRESSED TODAY  Binge eating Improving with mindful eating and counseling for depression and anxiety.  Adding Zepbound  has given her feedback in fullness sensation which has resulted in a reduction in binge eating tendencies.  Morbid obesity (HCC) -     Tirzepatide -Weight Management; Inject 5 mg into the skin once a week.  Dispense: 2 mL; Refill: 0 Without meal skipping , hypoglycemia or nausea, it is appropriate to increase dose of Zepbound  to 5 mg weekly  Patient was counseled on the importance of maintaining healthy lifestyle habits, including balanced nutrition, regular physical activity, and behavioral modifications, while  taking antiobesity medication.  Patient verbalized understanding that medication is an adjunct to, not a replacement for, lifestyle changes and that the long-term success and weight maintenance depend on continued adherence to these strategies.  BMI 50.0-59.9, adult (HCC)  Depression, recurrent Improving  after recent breakup. Continue current psych meds per Kenney Roys FNP Continue counseling  Prediabetes Lab Results  Component Value Date   HGBA1C 5.7 (H) 10/21/2023   Continue to work on reducing intake of added sugar, ramping up exercise frequency and use Zepbound  for obesity management.  Repeat A1c in 1-2 mos     She was informed of the importance of frequent follow up visits to maximize her success with intensive lifestyle modifications for her multiple health conditions.   ATTESTASTION STATEMENTS:  Reviewed by clinician on day of visit: allergies, medications, problem list, medical history, surgical history, family history, social history, and previous encounter notes pertinent to obesity diagnosis.   I have personally spent 30 minutes total time today in preparation, patient care, nutritional counseling and education,  and documentation for this visit, including the following: review of most recent clinical lab tests, prescribing medications/ refilling medications, reviewing medical assistant documentation, review and interpretation of bioimpedence results.     Darice Haddock, D.O. DABFM, DABOM [facility] [address] [place], [place] [address] [phone] "

## 2024-05-12 NOTE — Patient Instructions (Signed)
 Increase Zepbound  to 5 mg weekly  Continue to work on eating on a schedule, better food choices, portion control  Aim for a good workout 3 days/ wk  Take a Women's MVI daily and OTC vitamin D  4,000 international units  daily

## 2024-06-09 ENCOUNTER — Ambulatory Visit: Admitting: Family Medicine
